# Patient Record
Sex: Male | Born: 1985 | Race: White | Hispanic: No | Marital: Single | Smoking: Never smoker
Health system: Southern US, Community
[De-identification: ages and names within clinical notes are randomized; demographics above are authoritative.]

## PROBLEM LIST (undated history)

## (undated) DIAGNOSIS — F4310 Post-traumatic stress disorder, unspecified: Secondary | ICD-10-CM

## (undated) HISTORY — PX: SHOULDER SURGERY: SHX246

## (undated) HISTORY — PX: KNEE SURGERY: SHX244

---

## 2017-12-12 ENCOUNTER — Encounter (HOSPITAL_COMMUNITY): Payer: Self-pay

## 2017-12-12 ENCOUNTER — Other Ambulatory Visit: Payer: Self-pay

## 2017-12-12 ENCOUNTER — Emergency Department (HOSPITAL_COMMUNITY)
Admission: EM | Admit: 2017-12-12 | Discharge: 2017-12-13 | Disposition: A | Discharge status: Home / Self Care | Payer: Self-pay | Attending: Emergency Medicine | Admitting: Emergency Medicine

## 2017-12-12 ENCOUNTER — Emergency Department (HOSPITAL_COMMUNITY): Payer: Self-pay

## 2017-12-12 DIAGNOSIS — R1031 Right lower quadrant pain: Secondary | ICD-10-CM | POA: Insufficient documentation

## 2017-12-12 DIAGNOSIS — M4306 Spondylolysis, lumbar region: Secondary | ICD-10-CM | POA: Insufficient documentation

## 2017-12-12 DIAGNOSIS — R109 Unspecified abdominal pain: Secondary | ICD-10-CM

## 2017-12-12 DIAGNOSIS — R3129 Other microscopic hematuria: Secondary | ICD-10-CM | POA: Insufficient documentation

## 2017-12-12 HISTORY — DX: Post-traumatic stress disorder, unspecified: F43.10

## 2017-12-12 LAB — COMPREHENSIVE METABOLIC PANEL
ALT: 19 U/L (ref 0–44)
AST: 22 U/L (ref 15–41)
Albumin: 4 g/dL (ref 3.5–5.0)
Alkaline Phosphatase: 33 U/L — ABNORMAL LOW (ref 38–126)
Anion gap: 9 (ref 5–15)
BUN: 9 mg/dL (ref 6–20)
CHLORIDE: 106 mmol/L (ref 98–111)
CO2: 28 mmol/L (ref 22–32)
Calcium: 9.5 mg/dL (ref 8.9–10.3)
Creatinine, Ser: 1.2 mg/dL (ref 0.61–1.24)
Glucose, Bld: 69 mg/dL — ABNORMAL LOW (ref 70–99)
POTASSIUM: 3.7 mmol/L (ref 3.5–5.1)
SODIUM: 143 mmol/L (ref 135–145)
Total Bilirubin: 1 mg/dL (ref 0.3–1.2)
Total Protein: 7.6 g/dL (ref 6.5–8.1)

## 2017-12-12 LAB — URINALYSIS, ROUTINE W REFLEX MICROSCOPIC
BACTERIA UA: NONE SEEN
Bilirubin Urine: NEGATIVE
Glucose, UA: NEGATIVE mg/dL
KETONES UR: NEGATIVE mg/dL
LEUKOCYTES UA: NEGATIVE
Nitrite: NEGATIVE
PROTEIN: NEGATIVE mg/dL
Specific Gravity, Urine: 1.004 — ABNORMAL LOW (ref 1.005–1.030)
pH: 7 (ref 5.0–8.0)

## 2017-12-12 LAB — CBC
HCT: 47.7 % (ref 39.0–52.0)
Hemoglobin: 15.5 g/dL (ref 13.0–17.0)
MCH: 29.4 pg (ref 26.0–34.0)
MCHC: 32.5 g/dL (ref 30.0–36.0)
MCV: 90.5 fL (ref 78.0–100.0)
Platelets: 274 10*3/uL (ref 150–400)
RBC: 5.27 MIL/uL (ref 4.22–5.81)
RDW: 13.7 % (ref 11.5–15.5)
WBC: 6.4 10*3/uL (ref 4.0–10.5)

## 2017-12-12 LAB — LIPASE, BLOOD: Lipase: 29 U/L (ref 11–51)

## 2017-12-12 MED ORDER — MORPHINE SULFATE (PF) 4 MG/ML IV SOLN
4.0000 mg | Freq: Once | INTRAVENOUS | Status: DC
  Filled 2017-12-12: qty 1

## 2017-12-12 MED ORDER — IOHEXOL 300 MG/ML  SOLN
100.0000 mL | Freq: Once | INTRAMUSCULAR | Status: AC | PRN
  Administered 2017-12-12: 100 mL via INTRAVENOUS

## 2017-12-12 NOTE — ED Triage Notes (Signed)
Pt states that last night he began to have severe RLQ abd pain that has got worse today, denies n/v/d/fevers. Pt states that the pain is so severe it is making his SOB, denies cough

## 2017-12-13 MED ORDER — KETOROLAC TROMETHAMINE 30 MG/ML IJ SOLN
30.0000 mg | Freq: Once | INTRAMUSCULAR | Status: AC
  Administered 2017-12-13: 30 mg via INTRAVENOUS
  Filled 2017-12-13: qty 1

## 2017-12-13 MED ORDER — ONDANSETRON 4 MG PO TBDP
4.0000 mg | ORAL_TABLET | Freq: Once | ORAL | Status: AC
  Administered 2017-12-13: 4 mg via ORAL
  Filled 2017-12-13: qty 1

## 2017-12-13 MED ORDER — ACETAMINOPHEN 500 MG PO TABS
1000.0000 mg | ORAL_TABLET | Freq: Once | ORAL | Status: AC
  Administered 2017-12-13: 1000 mg via ORAL
  Filled 2017-12-13: qty 2

## 2017-12-13 NOTE — ED Notes (Signed)
Pt given ginger ale drink

## 2017-12-13 NOTE — ED Provider Notes (Signed)
[facility] EMERGENCY DEPARTMENT Provider Note   CSN: [id number] Arrival date & time: 12/12/17  2102     History   Chief Complaint Chief Complaint  Patient presents with  . Abdominal Pain  . Shortness of Breath    HPI Devon White is a 32 y.o. male.  HPI  Patient is a 32 year old male with a history of PTSD presenting for abdominal pain.  Patient describes that it began yesterday and was periumbilical to right lower quadrant in origin, however it is resolved at present.  Patient reports that it was at its most severe early this morning.  Patient reports that breathing deeply exacerbate the pain in his abdomen.  Patient denies any fever or chills.  Patient reports nausea without vomiting.  Patient reports he had 10 times diarrhea without melena or hematochezia in the last 24 hours.  Patient denies testicular pain, testicular swelling, urinary urgency, frequency, or dysuria.  No rectal pain.  No remedies tried for symptoms.  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  Past Medical History:  Diagnosis Date  . PTSD (post-traumatic stress disorder)     There are no active problems to display for this patient.   Past Surgical History:  Procedure Laterality Date  . KNEE SURGERY    . SHOULDER SURGERY          Home Medications    Prior to Admission medications   Not on File    Family History No family history on file.  Social History Social History   Tobacco Use  . Smoking status: Never Smoker  . Smokeless tobacco: Current User  Substance Use Topics  . Alcohol use: Yes  . Drug use: Yes    Types: Marijuana     Allergies   Amoxicillin   Review of Systems Review of Systems  Constitutional: Negative for chills and fever.  HENT: Negative for congestion, rhinorrhea and sore throat.   Eyes: Negative for visual disturbance.    Respiratory: Negative for shortness of breath.   Cardiovascular: Negative for chest pain.  Gastrointestinal: Positive for abdominal pain, diarrhea and nausea. Negative for blood in stool and vomiting.  Genitourinary: Negative for dysuria, flank pain, penile pain, penile swelling, scrotal swelling and testicular pain.  Musculoskeletal: Negative for back pain and myalgias.  Skin: Negative for rash.  Neurological: Negative for dizziness and light-headedness.     Physical Exam Updated Vital Signs BP 114/76   Pulse 68   Temp 98.7 F (37.1 C) (Oral)   Resp 17   Ht 6\' 1"  (1.854 m)   Wt 106.6 kg (235 lb)   SpO2 98%   BMI 31.00 kg/m   Physical Exam  Constitutional: He appears well-developed and well-nourished. No distress.  HENT:  Head: Normocephalic and atraumatic.  Mouth/Throat: Oropharynx is clear and moist.  Eyes: Pupils are equal, round, and reactive to light. Conjunctivae and EOM are normal.  Neck: Normal range of motion. Neck supple.  Cardiovascular: Normal rate, regular rhythm, S1 normal and S2 normal.  No murmur heard. Pulmonary/Chest: Effort normal and breath sounds normal. He has no wheezes. He has no rales.  Abdominal: Soft. He exhibits no distension. There is tenderness. There is no guarding.  Periumbilical pain to deep palpation.  No guarding or rebound.  Musculoskeletal: Normal range of motion. He exhibits no edema or deformity.  Lymphadenopathy:    He has no cervical adenopathy.  Neurological: He  is alert.  Cranial nerves grossly intact. Patient moves extremities symmetrically and with good coordination.  Skin: Skin is warm and dry. No rash noted. No erythema.  Psychiatric: He has a normal mood and affect. His behavior is normal. Judgment and thought content normal.  Nursing note and vitals reviewed.    ED Treatments / Results  Labs (all labs ordered are listed, but only abnormal results are displayed) Labs Reviewed  COMPREHENSIVE METABOLIC PANEL -  Abnormal; Notable for the following components:      Result Value   Glucose, Bld 69 (*)    Alkaline Phosphatase 33 (*)    All other components within normal limits  URINALYSIS, ROUTINE W REFLEX MICROSCOPIC - Abnormal; Notable for the following components:   Color, Urine STRAW (*)    Specific Gravity, Urine 1.004 (*)    Hgb urine dipstick SMALL (*)    All other components within normal limits  LIPASE, BLOOD  CBC    EKG EKG Interpretation  Date/Time:  Wednesday December 12 2017 21:10:31 EDT Ventricular Rate:  71 PR Interval:  154 QRS Duration: 94 QT Interval:  366 QTC Calculation: 397 R Axis:   65 Text Interpretation:  Normal sinus rhythm Early repolarization Normal ECG No old tracing to compare Confirmed by Melene Plan [phone]) on 12/12/2017 10:02:26 PM   Radiology Ct Abdomen Pelvis W Contrast  Result Date: 12/12/2017 CLINICAL DATA:  Abdominal pain and diarrhea EXAM: CT ABDOMEN AND PELVIS WITH CONTRAST TECHNIQUE: Multidetector CT imaging of the abdomen and pelvis was performed using the standard protocol following bolus administration of intravenous contrast. CONTRAST:  OMNIPAQUE IOHEXOL 300 MG/ML  SOLN COMPARISON:  None. FINDINGS: Lower chest: No acute abnormality. Hepatobiliary: No focal liver abnormality is seen. No gallstones, gallbladder wall thickening, or biliary dilatation. Pancreas: Unremarkable. No pancreatic ductal dilatation or surrounding inflammatory changes. Spleen: Normal in size without focal abnormality. Adrenals/Urinary Tract: Adrenal glands are unremarkable. Kidneys are normal, without renal calculi, focal lesion, or hydronephrosis. Bladder is unremarkable. Stomach/Bowel: Stomach is within normal limits. Appendix appears normal. No evidence of bowel wall thickening, distention, or inflammatory changes. Vascular/Lymphatic: No significant vascular findings are present. No enlarged abdominal or pelvic lymph nodes. Reproductive: Prostate is unremarkable. Other: No  abdominal wall hernia or abnormality. No abdominopelvic ascites. 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 Moderate degenerative changes at L5-S1. IMPRESSION: 1. Negative for appendicitis. No CT evidence for acute intra-abdominal or pelvic abnormality. 2. 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 Electronically Signed   By: Jasmine Pang M.D.   On: 12/12/2017 23:57    Procedures Procedures (including critical care time)  Medications Ordered in ED Medications  iohexol (OMNIPAQUE) 300 MG/ML solution 100 mL (100 mLs Intravenous Contrast Given 12/12/17 2336)  acetaminophen (TYLENOL) tablet 1,000 mg (1,000 mg Oral Given 12/13/17 0016)  ondansetron (ZOFRAN-ODT) disintegrating tablet 4 mg (4 mg Oral Given 12/13/17 0017)  ketorolac (TORADOL) 30 MG/ML injection 30 mg (30 mg Intravenous Given 12/13/17 0034)     Initial Impression / Assessment and Plan / ED Course  I have reviewed the triage vital signs and the nursing notes.  Pertinent labs & imaging results that were available during my care of the patient were reviewed by me and considered in my medical decision making (see chart for details).  Clinical Course as of Dec 14 346  Wed Dec 12, 2017  2245 Noted. Will have patient follow up with urology for further  workup.  Hgb urine dipstick(!): SMALL [AM]    Clinical Course User Index [AM] Elisha PonderMurray, Kaneisha Ellenberger B, PA-C    Patient nontoxic-appearing, afebrile, and with benign and nonsurgical abdomen.  Differential diagnosis includes appendicitis, nephrolithiasis, gastroenteritis, cholecystitis.   Work-up significant for microscopic hematuria, but no leukocytosis.  CT abdomen pelvis to evaluate for appendicitis without acute ab normality, but does note grade 1 pars defect.  I discussed this with the patient to follow-up with his team orthopedic physician, as he is a Building surveyorprofessional athlete.  Additionally, I discussed with the  patient that he will need a repeat dipstick urine within the month to follow-up on microscopic hematuria.  Patient was pain-free on my final reevaluation, and had no further abdominal pain.  I suspect this may be abdominal wall pathology.  Patient was given strict return precautions for any new or worsening abdominal pain, intractable nausea or vomiting, or fever chills with abdominal pain.  Patient is in understanding and agrees with the plan of care.  Final Clinical Impressions(s) / ED Diagnoses   Final diagnoses:  Right sided abdominal pain  Microscopic hematuria  Pars defect of lumbar spine    ED Discharge Orders    None       Delia ChimesMurray, Carmeline Kowal B, PA-C 12/13/17 0356    Melene PlanFloyd, Dan, DO 12/13/17 1557

## 2017-12-13 NOTE — Discharge Instructions (Addendum)
Please see the information and instructions below regarding your visit.  Your diagnoses today include:  1. Right sided abdominal pain   2. Microscopic hematuria   3. Pars defect of lumbar spine     Your exam and testing today is reassuring that there is not a condition causing your abdominal pain that we immediately need to intervene on at this time.   Abdominal (belly) pain can be caused by many things. Your caregiver performed an examination and possibly ordered blood/urine tests and imaging (CT scan, x-rays, ultrasound). Many cases can be observed and treated at home after initial evaluation in the emergency department. Even though you are being discharged home, abdominal pain can be unpredictable. Therefore, you need a repeated exam if your pain does not resolve, returns, or worsens. Most patients with abdominal pain don't have to be admitted to the hospital or have surgery, but serious problems like appendicitis and gallbladder attacks can start out as nonspecific pain. Many abdominal conditions cannot be diagnosed in one visit, so follow-up evaluations are very important.  Tests performed today include: Blood counts and electrolytes Blood tests to check liver and kidney function Blood tests to check pancreas function Urine test to look for infection and pregnancy (in women) Vital signs. See below for your results today.   See side panel of your discharge paperwork for testing performed today. Vital signs are listed at the bottom of these instructions.   Your urine testing did show you have a small amount of microscopic hematuria.  This needs to be followed up within the month with a repeat urinalysis.  CT scan showed no evidence of acute appendicitis nor any other, inflammatory changes within the abdomen.  Medications prescribed:    Take any prescribed medications only as prescribed, and any over the counter medications only as directed on the packaging.  Home care instructions:    Tr y eating, but start with foods that have a lot of fluid in them. Good examples are soup, Jell-O, and popsicles. If you do OK with those foods, you can try soft, bland foods, such as plain yogurt. Foods that are high in carbohydrates ("carbs"), like bread or saltine crackers, can help settle your stomach. Some people also find that ginger helps with nausea. You should avoid foods that have a lot of fat in them. They can make nausea worse. Call your doctor if your symptoms come back when you try to eat.  Please follow any educational materials contained in this packet.   Follow-up instructions: Please follow-up with your primary care provider in one week.  Please follow up with orthopedics regarding the pars defect found on CT scan.  Please follow-up with primary care and/or urology regarding the small amount of blood found in the urine.  Return instructions:  Please return to the Emergency Department if you experience worsening symptoms.  SEEK IMMEDIATE MEDICAL ATTENTION IF: The pain does not go away or becomes severe  A temperature above 101F develops  Repeated vomiting occurs (multiple episodes)  The pain becomes localized to portions of the abdomen. The right side could possibly be appendicitis. In an adult, the left lower portion of the abdomen could be colitis or diverticulitis.  Blood is being passed in stools or vomit (bright red or black tarry stools)  You develop chest pain, difficulty breathing, dizziness or fainting, or become confused, poorly responsive, or inconsolable (young children) If you have any other emergent concerns regarding your health  Additional Information:   Your vital signs today  were: BP 114/68    Pulse 63    Temp 98.7 F (37.1 C) (Oral)    Resp 17    Ht 6\' 1"  (1.854 m)    Wt 106.6 kg (235 lb)    SpO2 100%    BMI 31.00 kg/m  If your blood pressure (BP) was elevated on multiple readings during this visit above 130 for the top number or above 80 for the  bottom number, please have this repeated by your primary care provider within one month. --------------  Thank you for allowing [place] to participate in your care today.

## 2017-12-13 NOTE — ED Notes (Signed)
Pt tolerating PO

## 2020-01-21 IMAGING — CT CT ABD-PELV W/ CM
2 of 4 series · 16 of 46 positions shown, 18 images · IV contrast (Omni 300)
Comparison: None.

CLINICAL DATA: Abdominal pain and diarrhea

EXAM:
CT ABDOMEN AND PELVIS WITH CONTRAST
TECHNIQUE: Multidetector CT imaging of the abdomen and pelvis was performed
using the standard protocol following bolus administration of
intravenous contrast.
CONTRAST:  100mL OMNIPAQUE IOHEXOL 300 MG/ML  SOLN

[Series 3: a/p w/ 5mm · axial · 0.89mm/px · z∈[+548,+1008]mm · 13 of 102 slices shown, 15 images]
[im 5/102  soft-tissue]
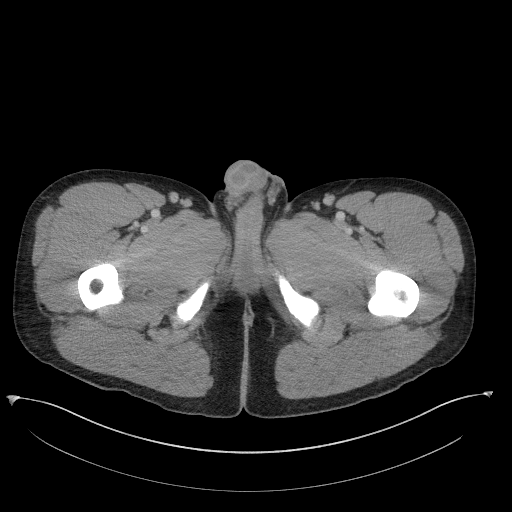
[im 5/102  bone]
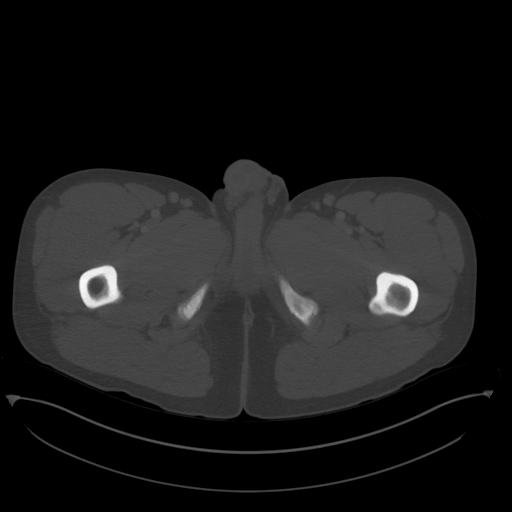
[im 13/102  soft-tissue]
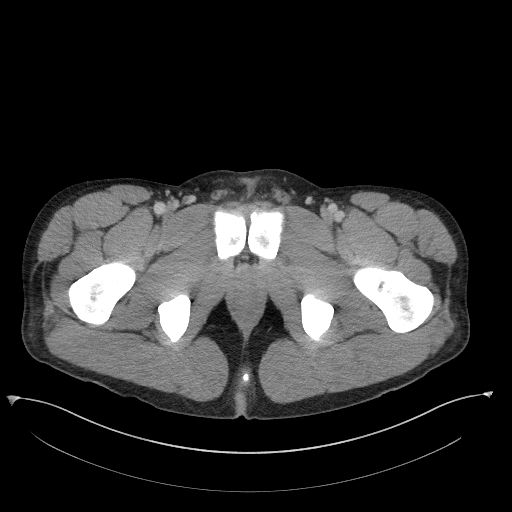
[im 22/102  soft-tissue]
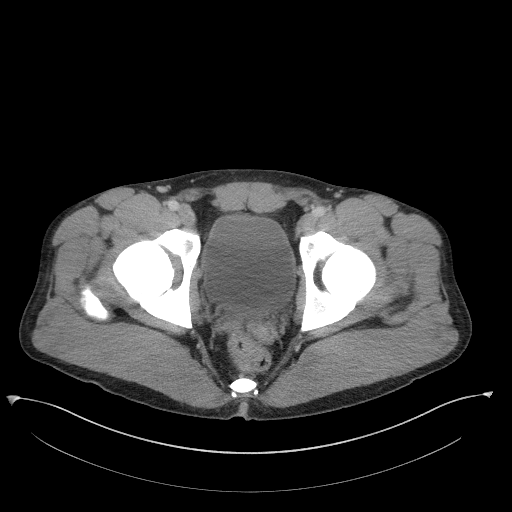
[im 30/102  soft-tissue]
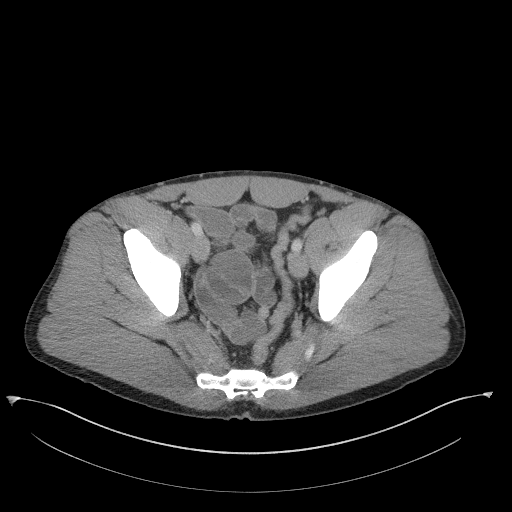
[im 34/102  soft-tissue]
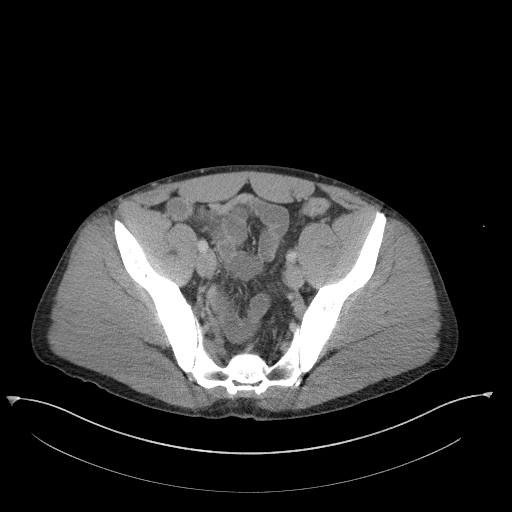
[im 43/102  soft-tissue]
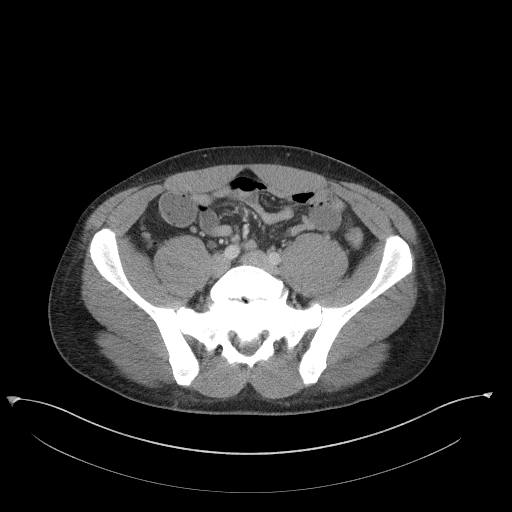
[im 51/102  soft-tissue]
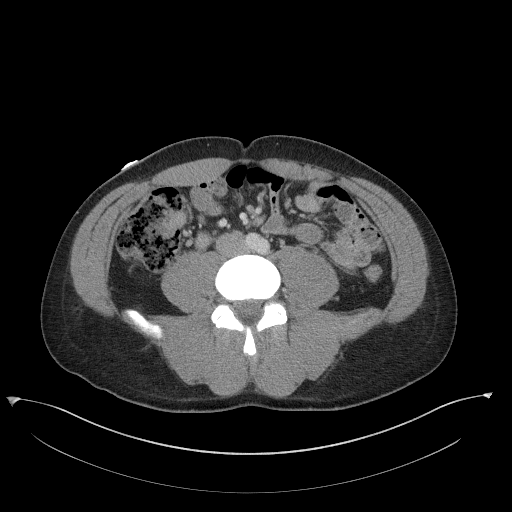
[im 59/102  soft-tissue]
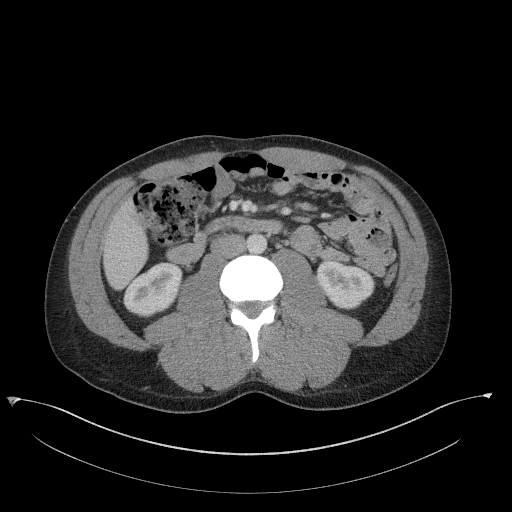
[im 68/102  soft-tissue]
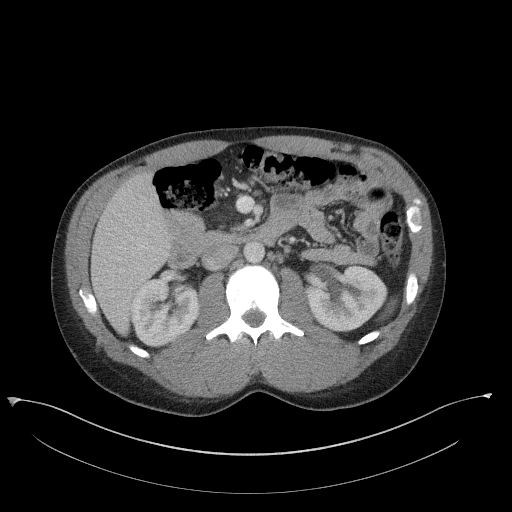
[im 68/102  bone]
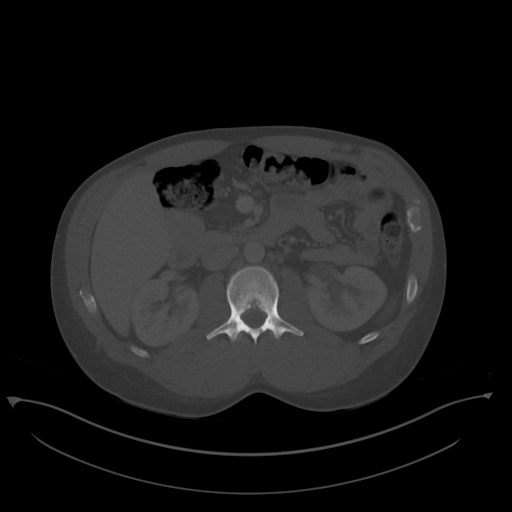
[im 72/102  soft-tissue]
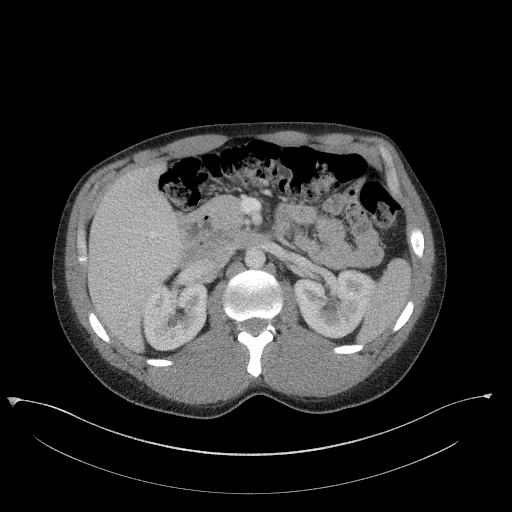
[im 80/102  soft-tissue]
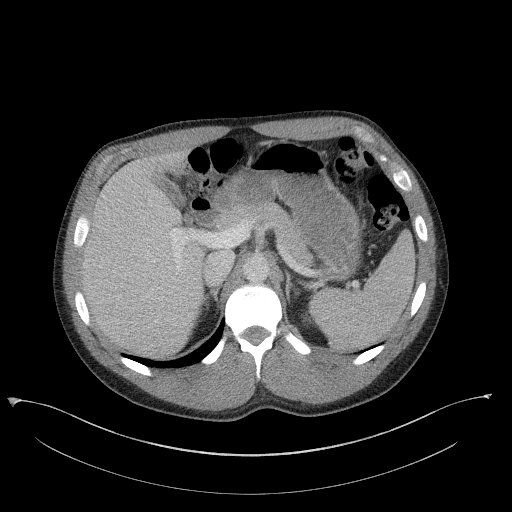
[im 89/102  soft-tissue]
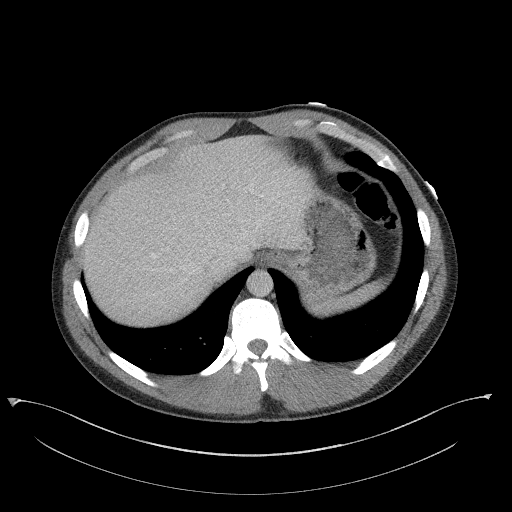
[im 97/102  soft-tissue]
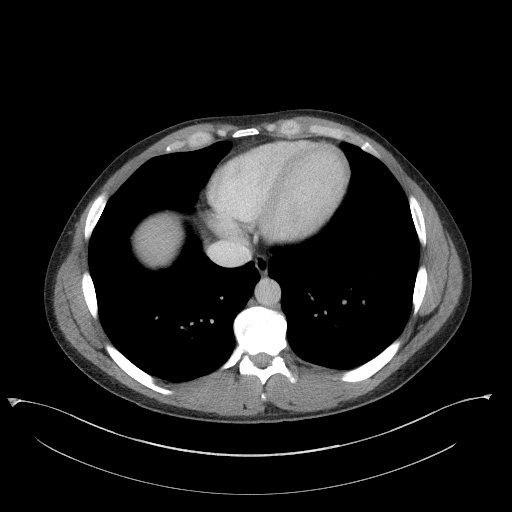

[Series 6: a/p w/ cor · coronal · 0.82mm/px · 3 of 141 slices shown]
[im 47/141  soft-tissue]
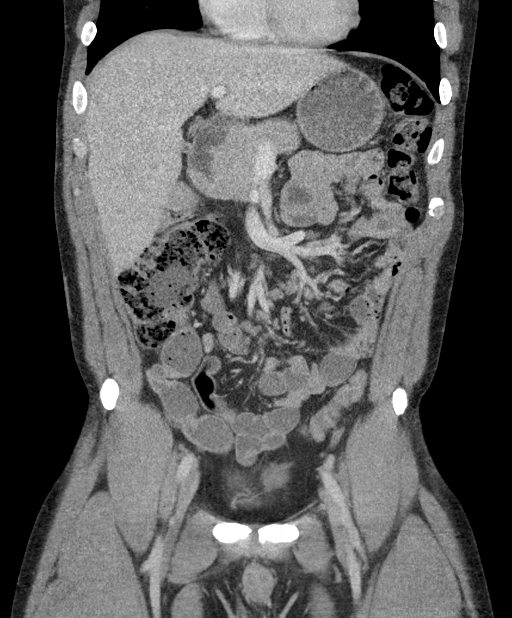
[im 63/141  soft-tissue]
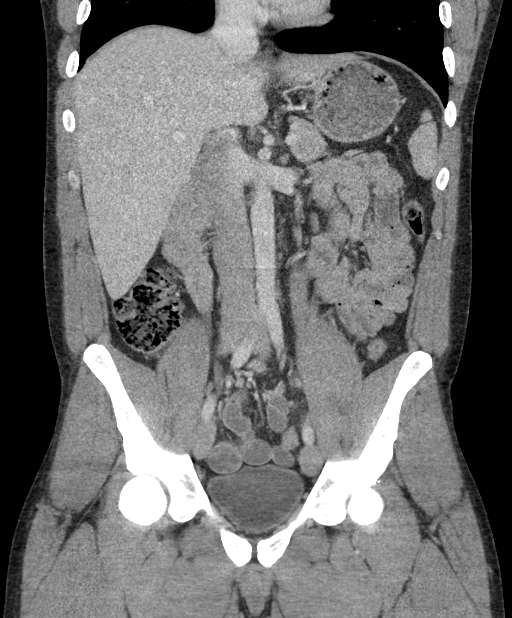
[im 78/141  soft-tissue]
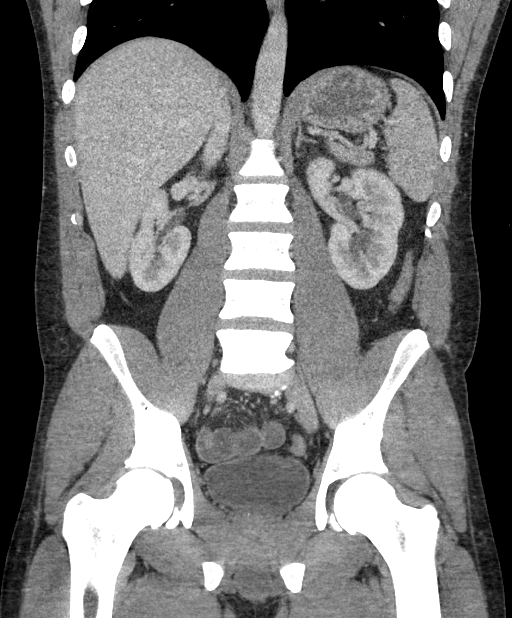

[16 of 46 positions shown; findings below may reference images not displayed]

FINDINGS: Lower chest: No acute abnormality.

Hepatobiliary: No focal liver abnormality is seen. No gallstones,
gallbladder wall thickening, or biliary dilatation.

Pancreas: Unremarkable. No pancreatic ductal dilatation or
surrounding inflammatory changes.

Spleen: Normal in size without focal abnormality.

Adrenals/Urinary Tract: Adrenal glands are unremarkable. Kidneys are
normal, without renal calculi, focal lesion, or hydronephrosis.
Bladder is unremarkable.

Stomach/Bowel: Stomach is within normal limits. Appendix appears
normal. No evidence of bowel wall thickening, distention, or
inflammatory changes.

Vascular/Lymphatic: No significant vascular findings are present. No
enlarged abdominal or pelvic lymph nodes.

Reproductive: Prostate is unremarkable.

Other: No abdominal wall hernia or abnormality. No abdominopelvic
ascites.

Musculoskeletal: Grade 1 anterolisthesis of L5 on S1 with chronic
appearing pars defect at L5. Moderate degenerative changes at L5-S1.
IMPRESSION: 1. Negative for appendicitis. No CT evidence for acute
intra-abdominal or pelvic abnormality.
2. Grade 1 anterolisthesis L5 on S1 with chronic appearing pars
defect at L5.
